# Patient Record
Sex: Male | Born: 1955 | Race: White | Hispanic: No | Marital: Married | Smoking: Never smoker
Health system: Southern US, Community
[De-identification: ages and names within clinical notes are randomized; demographics above are authoritative.]

## PROBLEM LIST (undated history)

## (undated) DIAGNOSIS — I10 Essential (primary) hypertension: Secondary | ICD-10-CM

## (undated) DIAGNOSIS — T148XXA Other injury of unspecified body region, initial encounter: Secondary | ICD-10-CM

## (undated) DIAGNOSIS — E785 Hyperlipidemia, unspecified: Secondary | ICD-10-CM

## (undated) DIAGNOSIS — F419 Anxiety disorder, unspecified: Secondary | ICD-10-CM

## (undated) DIAGNOSIS — G2581 Restless legs syndrome: Secondary | ICD-10-CM

## (undated) DIAGNOSIS — E119 Type 2 diabetes mellitus without complications: Secondary | ICD-10-CM

## (undated) HISTORY — PX: COLONOSCOPY: SHX174

## (undated) HISTORY — DX: Type 2 diabetes mellitus without complications: E11.9

## (undated) HISTORY — DX: Anxiety disorder, unspecified: F41.9

## (undated) HISTORY — PX: OTHER SURGICAL HISTORY: SHX169

## (undated) HISTORY — DX: Restless legs syndrome: G25.81

## (undated) HISTORY — PX: ESOPHAGOGASTRODUODENOSCOPY: SHX1529

## (undated) HISTORY — DX: Other injury of unspecified body region, initial encounter: T14.8XXA

## (undated) HISTORY — DX: Essential (primary) hypertension: I10

## (undated) HISTORY — DX: Hyperlipidemia, unspecified: E78.5

---

## 2010-05-24 HISTORY — PX: POLYPECTOMY: SHX149

## 2010-06-01 ENCOUNTER — Ambulatory Visit: Payer: Self-pay | Admitting: Gastroenterology

## 2010-06-02 ENCOUNTER — Ambulatory Visit: Payer: Self-pay | Admitting: Gastroenterology

## 2010-06-08 ENCOUNTER — Ambulatory Visit: Payer: Self-pay | Admitting: General Surgery

## 2010-06-14 ENCOUNTER — Ambulatory Visit: Payer: Self-pay | Admitting: Cardiovascular Disease

## 2010-06-14 ENCOUNTER — Inpatient Hospital Stay: Payer: Self-pay | Admitting: General Surgery

## 2011-01-22 IMAGING — NM NUCLEAR MEDICINE GASTROINTESTINAL BLEEDING STUDY
1 series · 6 of 6 positions shown · non-contrast
Comparison: No comparison

REASON FOR EXAM: GI bleeding
COMMENTS:   LMP: (Male)

PROCEDURE:     NM  - NM GI BLOOD LOSS STUDY  - June 17, 2010  [DATE]
RESULT:     Indication: GI bleed
TECHNIQUE: Dynamic, anterior planar images of the abdomen were obtained over
60 minutes.

[Series 1000: gi bleed · 4.80mm/px · 6 of 250 frames shown]
[frame 21/250]
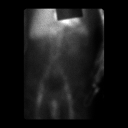
[frame 63/250]
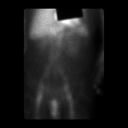
[frame 105/250]
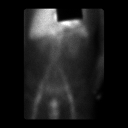
[frame 146/250]
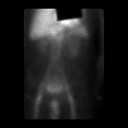
[frame 188/250]
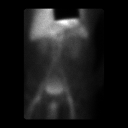
[frame 230/250]
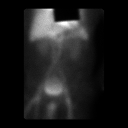

[6 of 6 positions shown; findings below may reference images not displayed]

Radiopharmaceutical: 24.5 to mCi Uc-WWm RBC administered intravenously. 3 mL
of pyrophosphate was utilized for tagging.
FINDINGS: No abnormal focus of activity is demonstrated in the bowel to
suggest a lower GI bleed. Normal physiologic biodistribution of the
radiotracer is demonstrated throughout the abdomen.
IMPRESSION: No scintigraphic evidence for a lower GI bleed.

## 2011-01-24 HISTORY — PX: CHOLECYSTECTOMY: SHX55

## 2011-02-05 ENCOUNTER — Inpatient Hospital Stay: Payer: Self-pay | Admitting: Surgery

## 2011-02-09 LAB — PATHOLOGY REPORT

## 2011-09-11 IMAGING — US ABDOMEN ULTRASOUND
1 series · 17 of 25 positions shown · non-contrast
Comparison: none

REASON FOR EXAM: epigastric pain , vomiting
COMMENTS:   LMP: (Male)

PROCEDURE:     US  - US ABDOMEN GENERAL SURVEY  - February 04, 2011 [DATE]
RESULT:     Abdominal ultrasound dated 02/04/2011.

[Series 1: abdomen ultrasound · 17 of 58 slices shown]
[im 1/58]
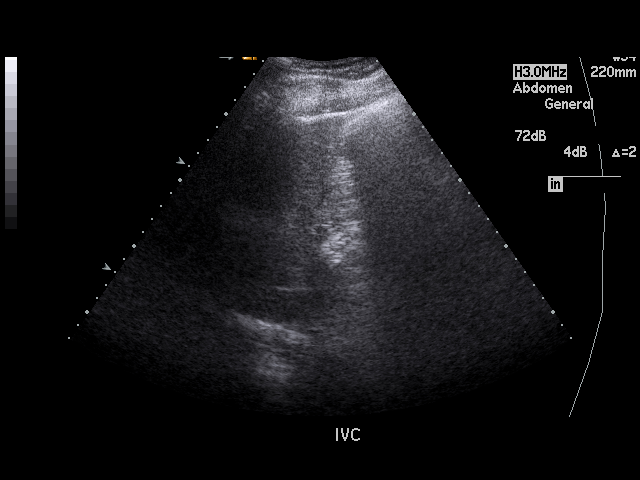
[im 5/58]
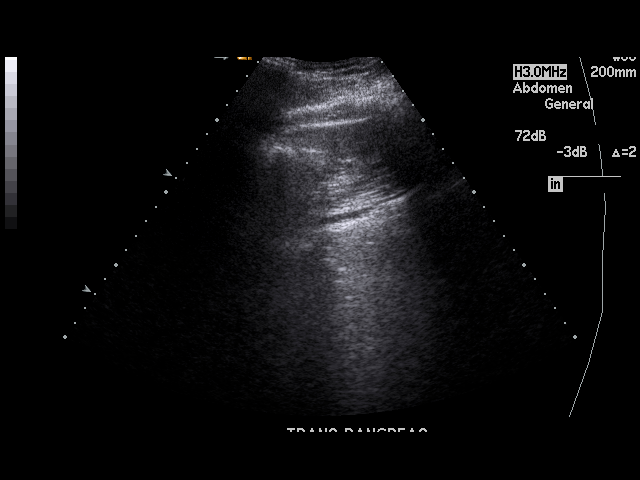
[im 8/58]
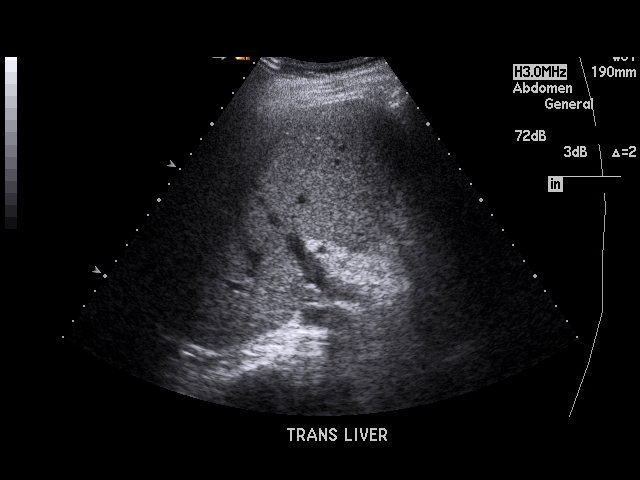
[im 12/58]
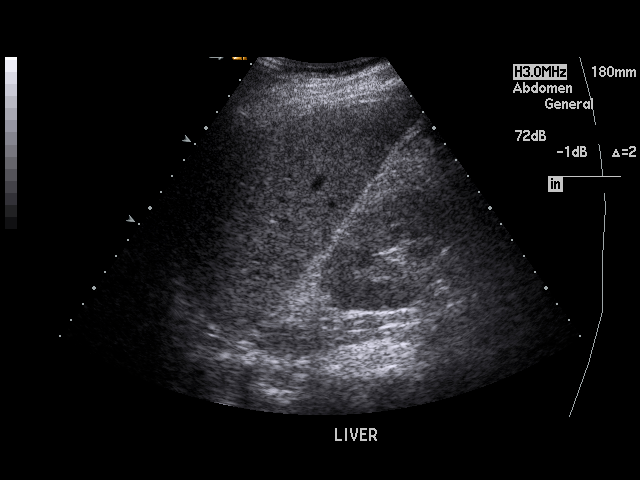
[im 15/58]
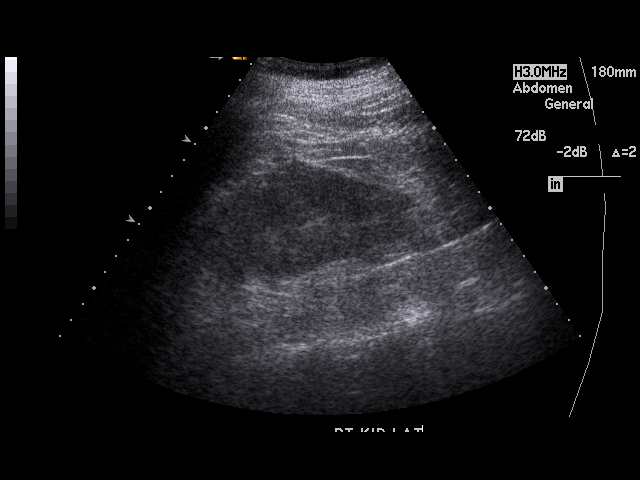
[im 20/58]
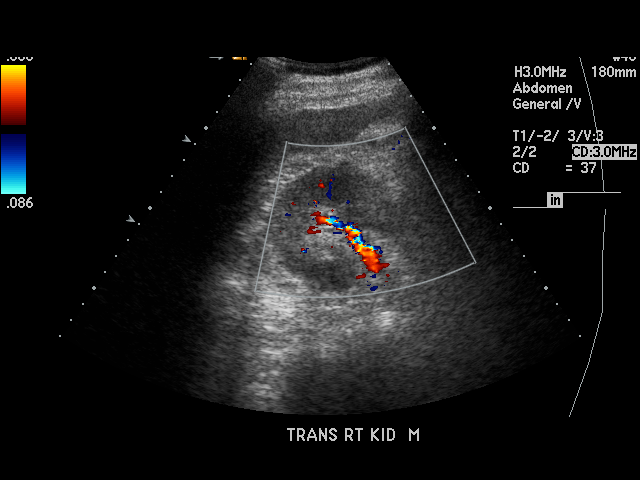
[im 22/58]
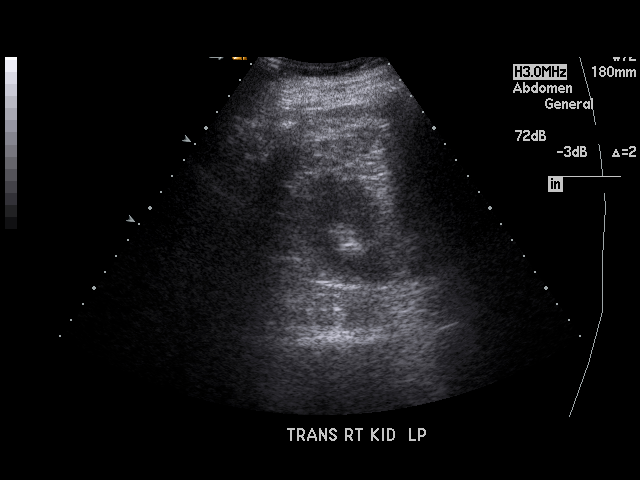
[im 27/58]
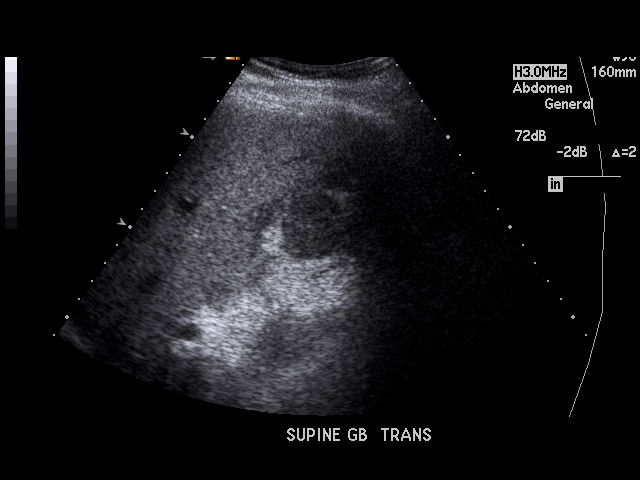
[im 29/58]
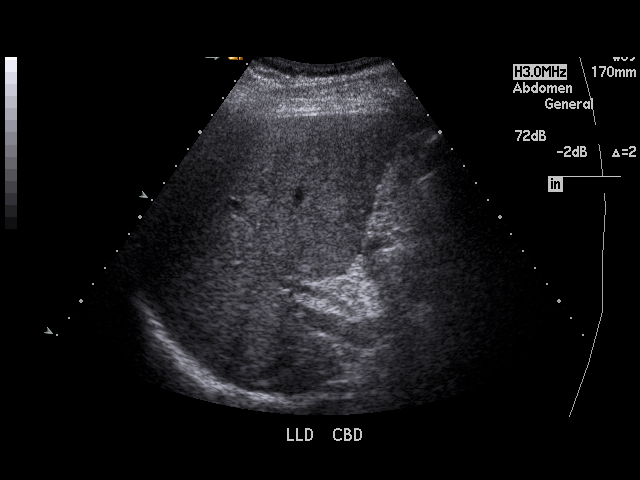
[im 31/58]
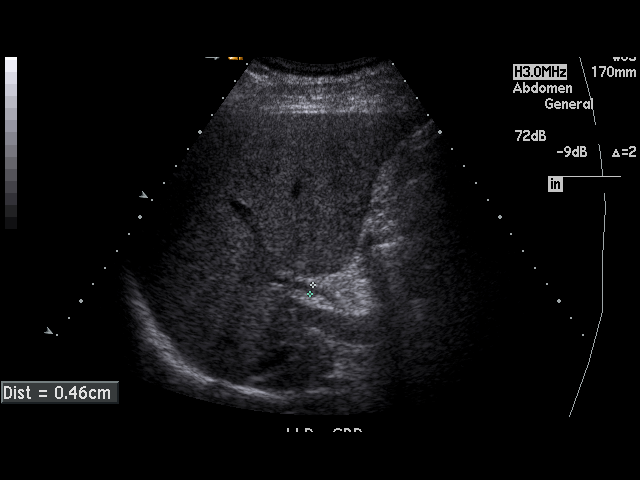
[im 36/58]
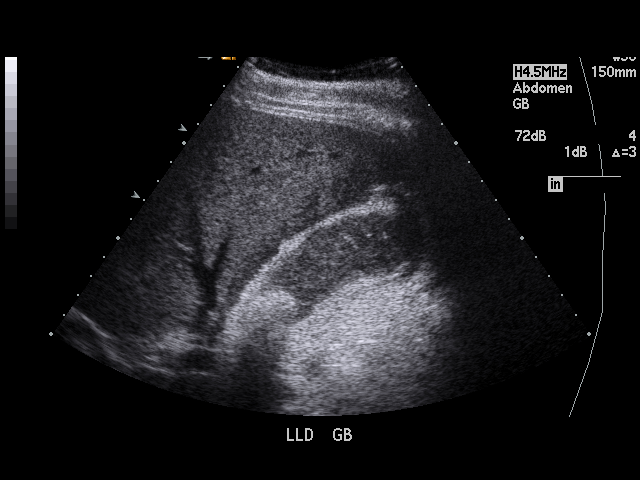
[im 39/58]
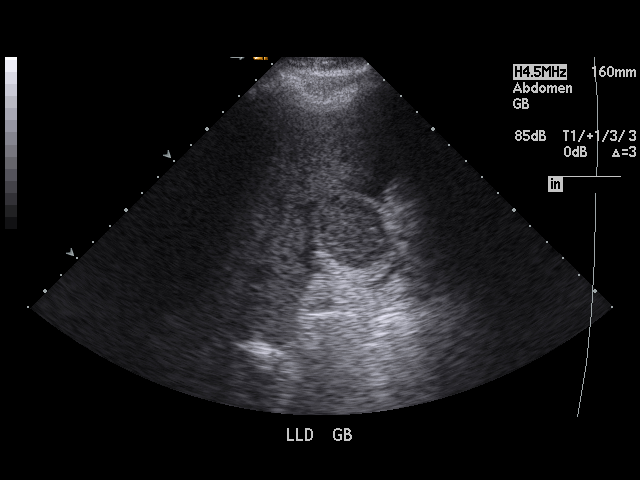
[im 43/58]
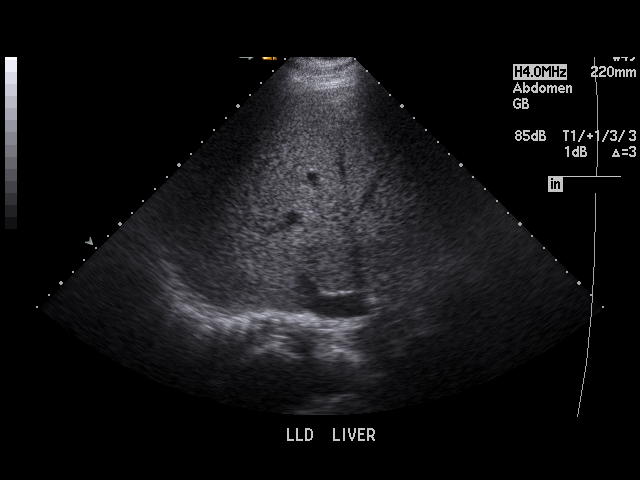
[im 46/58]
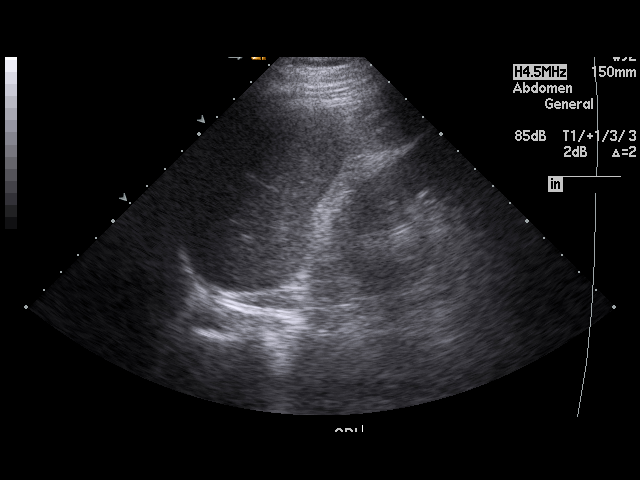
[im 50/58]
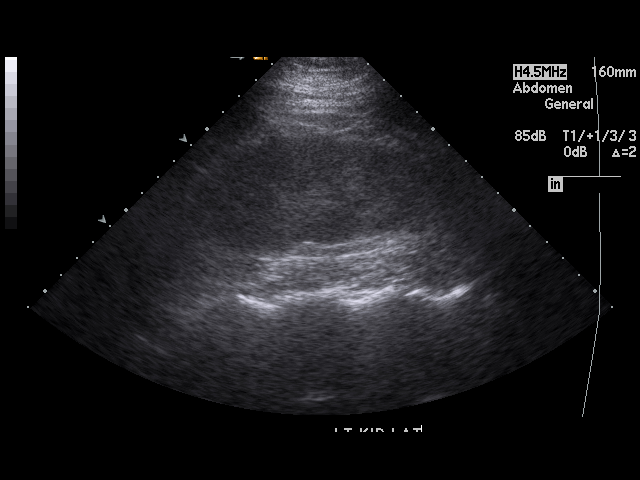
[im 53/58]
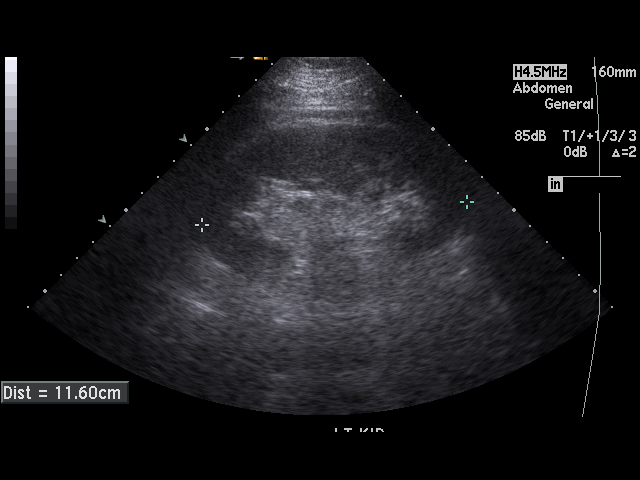
[im 58/58]
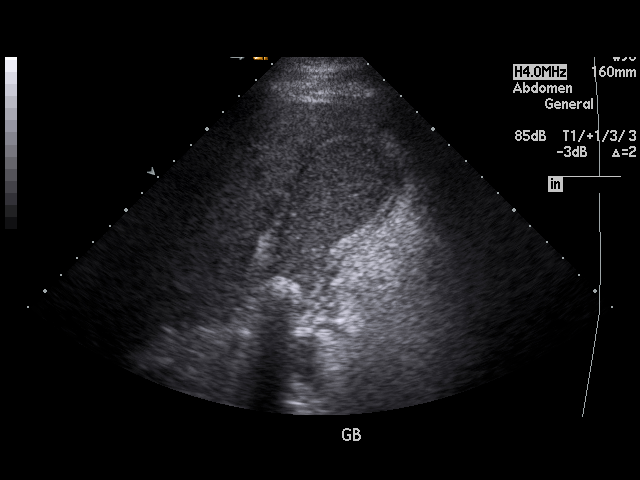

[17 of 25 positions shown; findings below may reference images not displayed]

FINDINGS: The liver demonstrates a homogeneous echotexture. Hepato- pedal
flow is identified within the portal vein. Visualized portions of the aorta
and IVC are unremarkable. The pancreas is nonvisualized.

A 2.5 cm gallstone is appreciated lodged within the neck of the gallbladder.
Sludge is also identified within the gallbladder.  The patient demonstrates
a positive sonographic Murphy's sign and the gallbladder wall is thickened
at 4.3 mm. The common bile that measures 4. 6 mm in diameter.
Pericholecystic fluid is appreciated. The spleen is homogeneous in
echotexture and measures 9.52 cm in longitudinal dimensions. The right
kidney measures 12.4 x 6 x 6.2 cm and the left 11.6 x 5 x 6.1 centimeters.
There is appropriate cortical medullary differentiation without evidence
hydronephrosis, masses nor calculi.
IMPRESSION: Ultrasound findings consistent with cholecystitis until
proven otherwise surgical consultation recommended.

## 2015-04-01 ENCOUNTER — Ambulatory Visit
Admit: 2015-04-01 | Disposition: A | Discharge status: Home / Self Care | Payer: Self-pay | Attending: Gastroenterology | Admitting: Gastroenterology

## 2015-04-15 DIAGNOSIS — F419 Anxiety disorder, unspecified: Secondary | ICD-10-CM | POA: Insufficient documentation

## 2015-04-15 DIAGNOSIS — E119 Type 2 diabetes mellitus without complications: Secondary | ICD-10-CM | POA: Insufficient documentation

## 2015-04-15 DIAGNOSIS — E785 Hyperlipidemia, unspecified: Secondary | ICD-10-CM | POA: Insufficient documentation

## 2015-04-15 DIAGNOSIS — I10 Essential (primary) hypertension: Secondary | ICD-10-CM | POA: Insufficient documentation

## 2015-09-30 ENCOUNTER — Other Ambulatory Visit: Payer: Self-pay

## 2015-09-30 NOTE — Telephone Encounter (Signed)
Needs an appointment. Will get him enough medicine to make it to appointment when it's booked.   

## 2015-09-30 NOTE — Telephone Encounter (Signed)
PATIENTKIER SMEAD DOB: 1956/10/26 MRN: [id number]  Patient requests lisinopril 10 mg tablet # 30.

## 2015-10-05 NOTE — Telephone Encounter (Signed)
He has appt scheduled for this Friday the 14th.

## 2015-10-07 ENCOUNTER — Ambulatory Visit (INDEPENDENT_AMBULATORY_CARE_PROVIDER_SITE_OTHER): Payer: Managed Care, Other (non HMO) | Admitting: Family Medicine

## 2015-10-07 ENCOUNTER — Encounter: Payer: Self-pay | Admitting: Family Medicine

## 2015-10-07 VITALS — BP 126/68 | HR 53 | Temp 98.0°F | Ht 70.1 in | Wt 180.0 lb

## 2015-10-07 DIAGNOSIS — E119 Type 2 diabetes mellitus without complications: Secondary | ICD-10-CM

## 2015-10-07 DIAGNOSIS — F419 Anxiety disorder, unspecified: Secondary | ICD-10-CM

## 2015-10-07 DIAGNOSIS — G2581 Restless legs syndrome: Secondary | ICD-10-CM | POA: Insufficient documentation

## 2015-10-07 DIAGNOSIS — I10 Essential (primary) hypertension: Secondary | ICD-10-CM

## 2015-10-07 DIAGNOSIS — E785 Hyperlipidemia, unspecified: Secondary | ICD-10-CM | POA: Diagnosis not present

## 2015-10-07 LAB — LIPID PANEL PICCOLO, WAIVED
Chol/HDL Ratio Piccolo,Waive: 3.8 mg/dL
Cholesterol Piccolo, Waived: 209 mg/dL — ABNORMAL HIGH (ref <200)
HDL Chol Piccolo, Waived: 55 mg/dL — ABNORMAL LOW (ref >59)
LDL Chol Calc Piccolo Waived: 124 mg/dL — ABNORMAL HIGH (ref <100)
Triglycerides Piccolo,Waived: 146 mg/dL (ref <150)
VLDL Chol Calc Piccolo,Waive: 29 mg/dL (ref <30)

## 2015-10-07 LAB — MICROALBUMIN, URINE WAIVED
Creatinine, Urine Waived: 100 mg/dL (ref 10–300)
MICROALB, UR WAIVED: 30 mg/L — AB (ref 0–19)
Microalb/Creat Ratio: <30 mg/g (ref <30)

## 2015-10-07 LAB — BAYER DCA HB A1C WAIVED: HB A1C (BAYER DCA - WAIVED): 6.5 % (ref <7.0)

## 2015-10-07 MED ORDER — LISINOPRIL 10 MG PO TABS: 10.0000 mg | ORAL_TABLET | Freq: Every day | ORAL | Status: DC

## 2015-10-07 MED ORDER — MELOXICAM 15 MG PO TABS: 15.0000 mg | ORAL_TABLET | Freq: Every day | ORAL | Status: DC

## 2015-10-07 MED ORDER — ROPINIROLE HCL 0.25 MG PO TABS: ORAL_TABLET | ORAL | Status: DC

## 2015-10-07 MED ORDER — PRAVASTATIN SODIUM 40 MG PO TABS: 40.0000 mg | ORAL_TABLET | Freq: Every day | ORAL | Status: DC

## 2015-10-07 NOTE — Assessment & Plan Note (Signed)
Under good control. Refill of medicine given today. Await results of CMP. Continue to monitor.

## 2015-10-07 NOTE — Assessment & Plan Note (Signed)
Continues to be diet controlled. Continue to monitor. Recheck 6 months.

## 2015-10-07 NOTE — Assessment & Plan Note (Signed)
Under poor control. Will start him on ropinirole and titrate up. Checking TSH and ferritin. Recheck in 1 month to see how he's doing.

## 2015-10-07 NOTE — Assessment & Plan Note (Signed)
Slightly elevated today. Continue medication- refill given today. Work on diet and exercise. Recheck 6 months.

## 2015-10-07 NOTE — Assessment & Plan Note (Signed)
Rechecking TSH today. Await results. Not interested in daily medication at this time.

## 2015-10-07 NOTE — Patient Instructions (Signed)
Restless Legs Syndrome Restless legs syndrome is a condition that causes uncomfortable feelings or sensations in the legs, especially while sitting or lying down. The sensations usually cause an overwhelming urge to move the legs. The arms can also sometimes be affected. The condition can range from mild to severe. The symptoms often interfere with a person's ability to sleep. CAUSES The cause of this condition is not known. RISK FACTORS This condition is more likely to develop in:  People who are older than age 50.  Pregnant women. In general, restless legs syndrome is more common in women than in men.  People who have a family history of the condition.  People who have certain medical conditions, such as iron deficiency, kidney disease, Parkinson disease, or nerve damage.  People who take certain medicines, such as medicines for high blood pressure, nausea, colds, allergies, depression, and some heart conditions. SYMPTOMS The main symptom of this condition is uncomfortable sensations in the legs. These sensations may be:  Described as pulling, tingling, prickling, throbbing, crawling, or burning.  Worse while you are sitting or lying down.  Worse during periods of rest or inactivity.  Worse at night, often interfering with your sleep.  Accompanied by a very strong urge to move your legs.  Temporarily relieved by movement of your legs. The sensations usually affect both sides of the body. The arms can also be affected, but this is rare. People who have this condition often have tiredness during the day because of their lack of sleep at night. DIAGNOSIS This condition may be diagnosed based on your description of the symptoms. You may also have tests, including blood tests, to check for other conditions that may lead to your symptoms. In some cases, you may be asked to spend some time in a sleep lab so your sleeping can be monitored. TREATMENT Treatment for this condition is  focused on managing the symptoms. Treatment may include:  Self-help and lifestyle changes.  Medicines. HOME CARE INSTRUCTIONS  Take medicines only as directed by your health care provider.  Try these methods to get temporary relief from the uncomfortable sensations:  Massage your legs.  Walk or stretch.  Take a cold or hot bath.  Practice good sleep habits. For example, go to bed and get up at the same time every day.  Exercise regularly.  Practice ways of relaxing, such as yoga or meditation.  Avoid caffeine and alcohol.  Do not use any tobacco products, including cigarettes, chewing tobacco, or electronic cigarettes. If you need help quitting, ask your health care provider.  Keep all follow-up visits as directed by your health care provider. This is important. SEEK MEDICAL CARE IF: Your symptoms do not improve with treatment, or they get worse.   This information is not intended to replace advice given to you by your health care provider. Make sure you discuss any questions you have with your health care provider.   Document Released: 11/30/2002 Document Revised: 04/26/2015 Document Reviewed: 12/06/2014 Elsevier Interactive Patient Education 2016 [employer]  

## 2015-10-07 NOTE — Progress Notes (Signed)
BP 126/68 mmHg  Pulse 53  Temp(Src) 98 F (36.7 C)  Ht 5' 10.1" (1.781 m)  Wt 180 lb (81.647 kg)  BMI 25.74 kg/m2  SpO2 99%   Subjective:    Patient ID: Patrick Cantu, male    DOB: 03-31-1956, 59 y.o.   MRN: [id number]  HPI: Patrick Cantu is a 59 y.o. male  Chief Complaint  Patient presents with  . Hypertension  . Hyperlipidemia   RESTLESS LEGS Duration: chronic- greater than 5 years Discomfort description:  Creeping and crawling Pain: no Location: whole leg Bilateral: yes Symmetric: yes Severity: moderate Onset:  gradual Frequency:  a few times a week Symptoms only occur while legs at rest: no Sudden unintentional leg jerking: yes Bed partner bothered by leg movements: yes LE numbness: no Decreased sensation: no Weakness: no Insomnia: yes- staying asleep Daytime somnolence: yes Fatigue: yes Alleviating factors: nothing Aggravating factors: nothing Status: worse Treatments attempted: nothing  HYPERTENSION Hypertension status: controlled  Satisfied with current treatment? yes Duration of hypertension: chronic BP monitoring frequency:  a few times a week BP medication side effects:  no Medication compliance: excellent compliance Aspirin: no- gets epistaxis Recurrent headaches: no Visual changes: no Palpitations: no Dyspnea: no Chest pain: no Lower extremity edema: no Dizzy/lightheaded: yes  HYPERLIPIDEMIA Hyperlipidemia status: excellent compliance Satisfied with current treatment?  yes Side effects:  no Medication compliance: excellent compliance Supplements: none Chest pain:  no Coronary artery disease:  no Family history CAD:  no Family history early CAD:  no  DIABETES Hypoglycemic episodes:no Polydipsia/polyuria: no Visual disturbance: no Chest pain: no Paresthesias: no Glucose Monitoring: no Retinal Examination: Up to Date- [facility] within the last year Foot Exam: Up to Date Diabetic Education: Completed Pneumovax:  refused Influenza: refused  Relevant past medical, surgical, family and social history reviewed and updated as indicated. Interim medical history since our last visit reviewed. Allergies and medications reviewed and updated.  Review of Systems  Constitutional: Negative.   Respiratory: Negative.   Cardiovascular: Negative.   Gastrointestinal: Negative.   Musculoskeletal: Positive for myalgias and arthralgias. Negative for back pain, joint swelling, gait problem, neck pain and neck stiffness.  Neurological: Negative.   Hematological: Negative.   Psychiatric/Behavioral: Negative.     Per HPI unless specifically indicated above     Objective:    BP 126/68 mmHg  Pulse 53  Temp(Src) 98 F (36.7 C)  Ht 5' 10.1" (1.781 m)  Wt 180 lb (81.647 kg)  BMI 25.74 kg/m2  SpO2 99%  Wt Readings from Last 3 Encounters:  10/07/15 180 lb (81.647 kg)  02/13/13 210 lb (95.255 kg)  04/15/15 179 lb (81.194 kg)    Physical Exam      Assessment & Plan:   Problem List Items Addressed This Visit      Cardiovascular and Mediastinum   Hypertension - Primary    Under good control. Refill of medicine given today. Await results of CMP. Continue to monitor.       Relevant Medications   lisinopril (PRINIVIL,ZESTRIL) 10 MG tablet   pravastatin (PRAVACHOL) 40 MG tablet   Other Relevant Orders   Comprehensive metabolic panel   Microalbumin, Urine Waived   TSH     Endocrine   Diabetes mellitus without complication (HCC)    Continues to be diet controlled. Continue to monitor. Recheck 6 months.       Relevant Medications   lisinopril (PRINIVIL,ZESTRIL) 10 MG tablet   pravastatin (PRAVACHOL) 40 MG tablet   Other Relevant Orders  Comprehensive metabolic panel   Bayer DCA Hb [id number] Waived   Microalbumin, Urine Waived     Other   Hyperlipidemia    Slightly elevated today. Continue medication- refill given today. Work on diet and exercise. Recheck 6 months.       Relevant Medications    lisinopril (PRINIVIL,ZESTRIL) 10 MG tablet   pravastatin (PRAVACHOL) 40 MG tablet   Other Relevant Orders   Comprehensive metabolic panel   Lipid Panel Piccolo, Waived   Anxiety    Rechecking TSH today. Await results. Not interested in daily medication at this time.       Relevant Orders   TSH   Restless legs syndrome (RLS)    Under poor control. Will start him on ropinirole and titrate up. Checking TSH and ferritin. Recheck in 1 month to see how he's doing.       Relevant Orders   Ferritin       Follow up plan: Return in about 4 weeks (around 11/04/2015) for Follow up on RLS.

## 2015-10-08 LAB — COMPREHENSIVE METABOLIC PANEL
A/G RATIO: 1.8 (ref 1.1–2.5)
ALT: 21 IU/L (ref 0–44)
AST: 20 IU/L (ref 0–40)
Albumin: 4.2 g/dL (ref 3.5–5.5)
Alkaline Phosphatase: 66 IU/L (ref 39–117)
BUN/Creatinine Ratio: 19 (ref 9–20)
BUN: 18 mg/dL (ref 6–24)
Bilirubin Total: 0.3 mg/dL (ref 0.0–1.2)
CALCIUM: 9.2 mg/dL (ref 8.7–10.2)
CO2: 24 mmol/L (ref 18–29)
CREATININE: 0.93 mg/dL (ref 0.76–1.27)
Chloride: 101 mmol/L (ref 97–108)
GFR, EST AFRICAN AMERICAN: 104 mL/min/{1.73_m2} (ref >59)
GFR, EST NON AFRICAN AMERICAN: 90 mL/min/{1.73_m2} (ref >59)
Globulin, Total: 2.4 g/dL (ref 1.5–4.5)
Glucose: 137 mg/dL — ABNORMAL HIGH (ref 65–99)
POTASSIUM: 4.7 mmol/L (ref 3.5–5.2)
Sodium: 140 mmol/L (ref 134–144)
TOTAL PROTEIN: 6.6 g/dL (ref 6.0–8.5)

## 2015-10-08 LAB — FERRITIN: Ferritin: 328 ng/mL (ref 30–400)

## 2015-10-08 LAB — TSH: TSH: 1.24 u[IU]/mL (ref 0.450–4.500)

## 2015-10-10 ENCOUNTER — Encounter: Payer: Self-pay | Admitting: Family Medicine

## 2015-11-11 ENCOUNTER — Encounter: Payer: Self-pay | Admitting: Family Medicine

## 2015-11-11 ENCOUNTER — Ambulatory Visit (INDEPENDENT_AMBULATORY_CARE_PROVIDER_SITE_OTHER): Payer: 59 | Admitting: Family Medicine

## 2015-11-11 VITALS — BP 134/72 | HR 63 | Temp 97.4°F | Wt 188.0 lb

## 2015-11-11 DIAGNOSIS — G2581 Restless legs syndrome: Secondary | ICD-10-CM

## 2015-11-11 MED ORDER — ROPINIROLE HCL 1 MG PO TABS: ORAL_TABLET | ORAL | Status: DC

## 2015-11-11 NOTE — Assessment & Plan Note (Signed)
Doing better on the requip. Will increase up to 2mg  as discussed and will check in by phone in 2 weeks to see how he's doing. Continue to monitor.

## 2015-11-11 NOTE — Progress Notes (Signed)
BP 134/72 mmHg  Pulse 63  Temp(Src) 97.4 F (36.3 C)  Wt 188 lb (85.276 kg)  SpO2 100%   Subjective:    Patient ID: Patrick Cantu, male    DOB: 10/06/1956, 59 y.o.   MRN: [id number]  HPI: Patrick Cantu is a 59 y.o. male  Chief Complaint  Patient presents with  . restless leg syndrome    Patient is here for a 1 month fuv on his legs   RESTLESS LEGS- taking 1.25mg  of the requip Duration: chronic Discomfort description:  Creeping, crawling and burning Pain: no Location: lower legs Bilateral: yes Symmetric: yes Severity: 4/10 Onset:  gradual Frequency:  every night Symptoms only occur while legs at rest: no Sudden unintentional leg jerking: yes Bed partner bothered by leg movements: yes LE numbness: no Decreased sensation: no Weakness: no Insomnia: no Daytime somnolence: no Fatigue: yes Status: better Treatments attempted: requip  Relevant past medical, surgical, family and social history reviewed and updated as indicated. Interim medical history since our last visit reviewed. Allergies and medications reviewed and updated.  Review of Systems  Constitutional: Negative.   Respiratory: Negative.   Cardiovascular: Negative.   Neurological: Negative.   Psychiatric/Behavioral: Negative.     Per HPI unless specifically indicated above     Objective:    BP 134/72 mmHg  Pulse 63  Temp(Src) 97.4 F (36.3 C)  Wt 188 lb (85.276 kg)  SpO2 100%  Wt Readings from Last 3 Encounters:  11/11/15 188 lb (85.276 kg)  10/07/15 180 lb (81.647 kg)  02/13/13 210 lb (95.255 kg)    Physical Exam  Constitutional: He is oriented to person, place, and time. He appears well-developed and well-nourished. No distress.  HENT:  Head: Normocephalic and atraumatic.  Right Ear: Hearing normal.  Left Ear: Hearing normal.  Nose: Nose normal.  Eyes: Conjunctivae and lids are normal. Right eye exhibits no discharge. Left eye exhibits no discharge. No scleral icterus.  Cardiovascular:  Normal rate, regular rhythm, normal heart sounds and intact distal pulses.  Exam reveals no gallop and no friction rub.   No murmur heard. Pulmonary/Chest: Effort normal and breath sounds normal. No respiratory distress. He has no wheezes. He has no rales. He exhibits no tenderness.  Musculoskeletal: Normal range of motion.  Neurological: He is alert and oriented to person, place, and time.  Skin: Skin is warm, dry and intact. No rash noted. No erythema. No pallor.  Psychiatric: He has a normal mood and affect. His speech is normal and behavior is normal. Judgment and thought content normal. Cognition and memory are normal.  Nursing note and vitals reviewed.   Results for orders placed or performed in visit on 10/07/15  Comprehensive metabolic panel  Result Value Ref Range   Glucose 137 (H) 65 - 99 mg/dL   BUN 18 6 - 24 mg/dL   Creatinine, Ser 4.69 0.76 - 1.27 mg/dL   GFR calc non Af Amer 90 >59 mL/min/1.73   GFR calc Af Amer 104 >59 mL/min/1.73   BUN/Creatinine Ratio 19 9 - 20   Sodium 140 134 - 144 mmol/L   Potassium 4.7 3.5 - 5.2 mmol/L   Chloride 101 97 - 108 mmol/L   CO2 24 18 - 29 mmol/L   Calcium 9.2 8.7 - 10.2 mg/dL   Total Protein 6.6 6.0 - 8.5 g/dL   Albumin 4.2 3.5 - 5.5 g/dL   Globulin, Total 2.4 1.5 - 4.5 g/dL   Albumin/Globulin Ratio 1.8 1.1 - 2.5  Bilirubin Total 0.3 0.0 - 1.2 mg/dL   Alkaline Phosphatase 66 39 - 117 IU/L   AST 20 0 - 40 IU/L   ALT 21 0 - 44 IU/L  Bayer DCA Hb A1c Waived  Result Value Ref Range   Bayer DCA Hb A1c Waived 6.5 <7.0 %  Lipid Panel Piccolo, Waived  Result Value Ref Range   Cholesterol Piccolo, Waived 209 (H) <200 mg/dL   HDL Chol Piccolo, Waived 55 (L) >59 mg/dL   Triglycerides Piccolo,Waived 146 <150 mg/dL   Chol/HDL Ratio Piccolo,Waive 3.8 mg/dL   LDL Chol Calc Piccolo Waived 124 (H) <100 mg/dL   VLDL Chol Calc Piccolo,Waive 29 <30 mg/dL  Microalbumin, Urine Waived  Result Value Ref Range   Microalb, Ur Waived 30 (H) 0 - 19  mg/L   Creatinine, Urine Waived 100 10 - 300 mg/dL   Microalb/Creat Ratio <30 <30 mg/g  TSH  Result Value Ref Range   TSH 1.240 0.450 - 4.500 uIU/mL  Ferritin  Result Value Ref Range   Ferritin 328 30 - 400 ng/mL      Assessment & Plan:   Problem List Items Addressed This Visit      Other   Restless legs syndrome (RLS) - Primary    Doing better on the requip. Will increase up to 2mg  as discussed and will check in by phone in 2 weeks to see how he's doing. Continue to monitor.           Follow up plan: Return in about 5 months (around 04/10/2016).

## 2015-11-28 ENCOUNTER — Telehealth: Payer: Self-pay | Admitting: Family Medicine

## 2015-11-28 NOTE — Telephone Encounter (Signed)
-----   Message from Dorcas CarrowMegan P Johnson, DO sent at 11/11/2015  8:58 AM EST ----- Call and check in on his RLS

## 2015-11-28 NOTE — Telephone Encounter (Signed)
Called to check in on his RLS- LMOM for him to call back.

## 2016-04-06 ENCOUNTER — Ambulatory Visit (INDEPENDENT_AMBULATORY_CARE_PROVIDER_SITE_OTHER): Payer: BLUE CROSS/BLUE SHIELD | Admitting: Family Medicine

## 2016-04-06 ENCOUNTER — Encounter: Payer: Self-pay | Admitting: Family Medicine

## 2016-04-06 VITALS — BP 117/67 | HR 60 | Temp 97.8°F | Ht 70.5 in | Wt 181.0 lb

## 2016-04-06 DIAGNOSIS — I10 Essential (primary) hypertension: Secondary | ICD-10-CM | POA: Diagnosis not present

## 2016-04-06 DIAGNOSIS — E119 Type 2 diabetes mellitus without complications: Secondary | ICD-10-CM

## 2016-04-06 DIAGNOSIS — E785 Hyperlipidemia, unspecified: Secondary | ICD-10-CM | POA: Diagnosis not present

## 2016-04-06 DIAGNOSIS — G2581 Restless legs syndrome: Secondary | ICD-10-CM | POA: Diagnosis not present

## 2016-04-06 LAB — LIPID PANEL PICCOLO, WAIVED
Chol/HDL Ratio Piccolo,Waive: 3.8 mg/dL
Cholesterol Piccolo, Waived: 143 mg/dL (ref <200)
HDL Chol Piccolo, Waived: 38 mg/dL — ABNORMAL LOW (ref >59)
LDL Chol Calc Piccolo Waived: 72 mg/dL (ref <100)
TRIGLYCERIDES PICCOLO,WAIVED: 167 mg/dL — AB (ref <150)
VLDL CHOL CALC PICCOLO,WAIVE: 33 mg/dL — AB (ref <30)

## 2016-04-06 LAB — BAYER DCA HB A1C WAIVED: HB A1C: 7.4 % — AB (ref <7.0)

## 2016-04-06 MED ORDER — LISINOPRIL 10 MG PO TABS: 10.0000 mg | ORAL_TABLET | Freq: Every day | ORAL | Status: AC

## 2016-04-06 MED ORDER — ROPINIROLE HCL 1 MG PO TABS: ORAL_TABLET | ORAL | Status: DC

## 2016-04-06 MED ORDER — LISINOPRIL 10 MG PO TABS: 10.0000 mg | ORAL_TABLET | Freq: Every day | ORAL | Status: DC

## 2016-04-06 MED ORDER — ROPINIROLE HCL 1 MG PO TABS: ORAL_TABLET | ORAL | Status: AC

## 2016-04-06 MED ORDER — PRAVASTATIN SODIUM 40 MG PO TABS: 40.0000 mg | ORAL_TABLET | Freq: Every day | ORAL | Status: DC

## 2016-04-06 NOTE — Assessment & Plan Note (Signed)
A1c going in wrong direction. Up to 7.4. Will really work on diet and exercise and will recheck in 3 months.

## 2016-04-06 NOTE — Assessment & Plan Note (Signed)
Under good control off medication today. Will keep him off medicine and recheck in 6 months. Continue to monitor.

## 2016-04-06 NOTE — Progress Notes (Signed)
BP 117/67 mmHg  Pulse 60  Temp(Src) 97.8 F (36.6 C)  Ht 5' 10.5" (1.791 m)  Wt 181 lb (82.101 kg)  BMI 25.60 kg/m2  SpO2 99%   Subjective:    Patient ID: Patrick Cantu, male    DOB: November 14, 1956, 60 y.o.   MRN: [id number]  HPI: Patrick Cantu is a 60 y.o. male  Chief Complaint  Patient presents with  . Hypertension  . Hyperlipidemia  . Diabetes   HYPERTENSION / HYPERLIPIDEMIA Satisfied with current treatment? yes Duration of hypertension: chronic BP monitoring frequency: not checking BP range:  BP medication side effects: no Duration of hyperlipidemia: chronic Cholesterol medication side effects: not taking any due to cost Cholesterol supplements: none Past cholesterol medications: pravastatin Medication compliance: poor compliance Aspirin: no Recent stressors: no Recurrent headaches: no Visual changes: no Palpitations: no Dyspnea: no Chest pain: no Lower extremity edema: no Dizzy/lightheaded: no  DIABETES Hypoglycemic episodes:no Polydipsia/polyuria: no Visual disturbance: no Chest pain: no Paresthesias: no Glucose Monitoring: no Blood Pressure Monitoring: not checking Retinal Examination: Not up to Date Foot Exam: Up to Date Diabetic Education: Not Completed Pneumovax: Refused Influenza: Refused Aspirin: no  RESTLESS LEGS Duration: chronic- has not been taking medicine, but it worked really well when he was taking it Discomfort description: Creeping, crawling and burning Pain: no Location: lower legs Bilateral: yes Symmetric: yes Severity: 4/10 Onset: gradual Frequency: every night Symptoms only occur while legs at rest: no Sudden unintentional leg jerking: yes Bed partner bothered by leg movements: yes LE numbness: no Decreased sensation: no Weakness: no Insomnia: no Daytime somnolence: no Fatigue: yes Status: better Treatments attempted: requip   Relevant past medical, surgical, family and social history reviewed and updated as  indicated. Interim medical history since our last visit reviewed. Allergies and medications reviewed and updated.  Review of Systems  Constitutional: Negative.   Respiratory: Negative.   Cardiovascular: Negative.   Musculoskeletal: Negative.   Psychiatric/Behavioral: Negative.     Per HPI unless specifically indicated above     Objective:    BP 117/67 mmHg  Pulse 60  Temp(Src) 97.8 F (36.6 C)  Ht 5' 10.5" (1.791 m)  Wt 181 lb (82.101 kg)  BMI 25.60 kg/m2  SpO2 99%  Wt Readings from Last 3 Encounters:  04/06/16 181 lb (82.101 kg)  11/11/15 188 lb (85.276 kg)  10/07/15 180 lb (81.647 kg)    Physical Exam  Constitutional: He is oriented to person, place, and time. He appears well-developed and well-nourished. No distress.  HENT:  Head: Normocephalic and atraumatic.  Right Ear: Hearing and external ear normal.  Left Ear: Hearing and external ear normal.  Nose: Nose normal.  Mouth/Throat: Oropharynx is clear and moist. No oropharyngeal exudate.  Eyes: Conjunctivae, EOM and lids are normal. Pupils are equal, round, and reactive to light. Right eye exhibits no discharge. Left eye exhibits no discharge. No scleral icterus.  Cardiovascular: Normal rate, regular rhythm, normal heart sounds and intact distal pulses.  Exam reveals no gallop and no friction rub.   No murmur heard. Pulmonary/Chest: Effort normal and breath sounds normal. No respiratory distress. He has no wheezes. He has no rales. He exhibits no tenderness.  Musculoskeletal: Normal range of motion.  Neurological: He is alert and oriented to person, place, and time.  Skin: Skin is warm and intact. No rash noted. He is not diaphoretic. No erythema. No pallor.  Psychiatric: He has a normal mood and affect. His speech is normal and behavior is normal. Judgment and  thought content normal. Cognition and memory are normal.  Nursing note and vitals reviewed.   Results for orders placed or performed in visit on 10/07/15   Comprehensive metabolic panel  Result Value Ref Range   Glucose 137 (H) 65 - 99 mg/dL   BUN 18 6 - 24 mg/dL   Creatinine, Ser 1.610.93 0.76 - 1.27 mg/dL   GFR calc non Af Amer 90 >59 mL/min/1.73   GFR calc Af Amer 104 >59 mL/min/1.73   BUN/Creatinine Ratio 19 9 - 20   Sodium 140 134 - 144 mmol/L   Potassium 4.7 3.5 - 5.2 mmol/L   Chloride 101 97 - 108 mmol/L   CO2 24 18 - 29 mmol/L   Calcium 9.2 8.7 - 10.2 mg/dL   Total Protein 6.6 6.0 - 8.5 g/dL   Albumin 4.2 3.5 - 5.5 g/dL   Globulin, Total 2.4 1.5 - 4.5 g/dL   Albumin/Globulin Ratio 1.8 1.1 - 2.5   Bilirubin Total 0.3 0.0 - 1.2 mg/dL   Alkaline Phosphatase 66 39 - 117 IU/L   AST 20 0 - 40 IU/L   ALT 21 0 - 44 IU/L  Bayer DCA Hb A1c Waived  Result Value Ref Range   Bayer DCA Hb A1c Waived 6.5 <7.0 %  Lipid Panel Piccolo, Waived  Result Value Ref Range   Cholesterol Piccolo, Waived 209 (H) <200 mg/dL   HDL Chol Piccolo, Waived 55 (L) >59 mg/dL   Triglycerides Piccolo,Waived 146 <150 mg/dL   Chol/HDL Ratio Piccolo,Waive 3.8 mg/dL   LDL Chol Calc Piccolo Waived 124 (H) <100 mg/dL   VLDL Chol Calc Piccolo,Waive 29 <30 mg/dL  Microalbumin, Urine Waived  Result Value Ref Range   Microalb, Ur Waived 30 (H) 0 - 19 mg/L   Creatinine, Urine Waived 100 10 - 300 mg/dL   Microalb/Creat Ratio <30 <30 mg/g  TSH  Result Value Ref Range   TSH 1.240 0.450 - 4.500 uIU/mL  Ferritin  Result Value Ref Range   Ferritin 328 30 - 400 ng/mL      Assessment & Plan:   Problem List Items Addressed This Visit      Cardiovascular and Mediastinum   Hypertension - Primary    Under good control. Continue current regimen. Continue to monitor. Call with any concerns.       Relevant Medications   lisinopril (PRINIVIL,ZESTRIL) 10 MG tablet   Other Relevant Orders   Comprehensive metabolic panel     Endocrine   Diabetes mellitus without complication (HCC)    A1c going in wrong direction. Up to 7.4. Will really work on diet and exercise and will  recheck in 3 months.       Relevant Medications   lisinopril (PRINIVIL,ZESTRIL) 10 MG tablet   Other Relevant Orders   Bayer DCA Hb A1c Waived   Comprehensive metabolic panel     Other   Hyperlipidemia    Under good control off medication today. Will keep him off medicine and recheck in 6 months. Continue to monitor.       Relevant Medications   lisinopril (PRINIVIL,ZESTRIL) 10 MG tablet   Other Relevant Orders   Comprehensive metabolic panel   Lipid Panel Piccolo, Waived   Restless legs syndrome (RLS)    Has been off of his medicine. Was working well. Will restart medicine. Call with any concerns.       Relevant Orders   CBC with Differential/Platelet       Follow up plan: Return in about 3 months (  around 07/06/2016) for DM visit.

## 2016-04-06 NOTE — Assessment & Plan Note (Signed)
Under good control. Continue current regimen. Continue to monitor. Call with any concerns. 

## 2016-04-06 NOTE — Assessment & Plan Note (Signed)
Has been off of his medicine. Was working well. Will restart medicine. Call with any concerns.

## 2016-04-07 LAB — COMPREHENSIVE METABOLIC PANEL
A/G RATIO: 1.5 (ref 1.2–2.2)
ALBUMIN: 4 g/dL (ref 3.5–5.5)
ALT: 25 IU/L (ref 0–44)
AST: 20 IU/L (ref 0–40)
Alkaline Phosphatase: 60 IU/L (ref 39–117)
BUN/Creatinine Ratio: 24 — ABNORMAL HIGH (ref 9–20)
BUN: 25 mg/dL — AB (ref 6–24)
Bilirubin Total: 0.3 mg/dL (ref 0.0–1.2)
CALCIUM: 8.9 mg/dL (ref 8.7–10.2)
CO2: 23 mmol/L (ref 18–29)
Chloride: 101 mmol/L (ref 96–106)
Creatinine, Ser: 1.04 mg/dL (ref 0.76–1.27)
GFR, EST AFRICAN AMERICAN: 90 mL/min/{1.73_m2} (ref >59)
GFR, EST NON AFRICAN AMERICAN: 78 mL/min/{1.73_m2} (ref >59)
GLUCOSE: 148 mg/dL — AB (ref 65–99)
Globulin, Total: 2.7 g/dL (ref 1.5–4.5)
Potassium: 4 mmol/L (ref 3.5–5.2)
Sodium: 140 mmol/L (ref 134–144)
TOTAL PROTEIN: 6.7 g/dL (ref 6.0–8.5)

## 2016-04-07 LAB — CBC WITH DIFFERENTIAL/PLATELET
BASOS: 2 %
Basophils Absolute: 0.1 10*3/uL (ref 0.0–0.2)
EOS (ABSOLUTE): 0.1 10*3/uL (ref 0.0–0.4)
Eos: 2 %
HEMOGLOBIN: 13.5 g/dL (ref 12.6–17.7)
Hematocrit: 38.3 % (ref 37.5–51.0)
IMMATURE GRANS (ABS): 0.1 10*3/uL (ref 0.0–0.1)
IMMATURE GRANULOCYTES: 1 %
LYMPHS: 36 %
Lymphocytes Absolute: 1.9 10*3/uL (ref 0.7–3.1)
MCH: 30.9 pg (ref 26.6–33.0)
MCHC: 35.2 g/dL (ref 31.5–35.7)
MCV: 88 fL (ref 79–97)
MONOCYTES: 13 %
Monocytes Absolute: 0.7 10*3/uL (ref 0.1–0.9)
NEUTROS PCT: 46 %
Neutrophils Absolute: 2.4 10*3/uL (ref 1.4–7.0)
PLATELETS: 251 10*3/uL (ref 150–379)
RBC: 4.37 x10E6/uL (ref 4.14–5.80)
RDW: 12.7 % (ref 12.3–15.4)
WBC: 5.3 10*3/uL (ref 3.4–10.8)

## 2016-04-09 ENCOUNTER — Encounter: Payer: Self-pay | Admitting: Family Medicine

## 2016-07-06 ENCOUNTER — Ambulatory Visit: Payer: BLUE CROSS/BLUE SHIELD | Admitting: Family Medicine

## 2016-07-27 ENCOUNTER — Ambulatory Visit: Payer: BLUE CROSS/BLUE SHIELD | Admitting: Family Medicine

## 2017-01-28 ENCOUNTER — Other Ambulatory Visit: Payer: Self-pay | Admitting: Family Medicine

## 2017-01-28 NOTE — Telephone Encounter (Signed)
Routing to provider
